# Patient Record
Sex: Female | Born: 1995
Health system: Southern US, Community
[De-identification: ages and names within clinical notes are randomized; demographics above are authoritative.]

## PROBLEM LIST (undated history)

## (undated) DIAGNOSIS — J189 Pneumonia, unspecified organism: Secondary | ICD-10-CM

## (undated) DIAGNOSIS — M25572 Pain in left ankle and joints of left foot: Secondary | ICD-10-CM

## (undated) DIAGNOSIS — S42309A Unspecified fracture of shaft of humerus, unspecified arm, initial encounter for closed fracture: Secondary | ICD-10-CM

## (undated) DIAGNOSIS — M357 Hypermobility syndrome: Secondary | ICD-10-CM

## (undated) HISTORY — DX: Pain in left ankle and joints of left foot: M25.572

## (undated) HISTORY — DX: Hypermobility syndrome: M35.7

## (undated) HISTORY — DX: Unspecified fracture of shaft of humerus, unspecified arm, initial encounter for closed fracture: S42.309A

## (undated) HISTORY — PX: TYMPANOSTOMY TUBE PLACEMENT: SHX32

## (undated) HISTORY — DX: Pneumonia, unspecified organism: J18.9

---
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
## 2016-01-16 ENCOUNTER — Telehealth: Payer: Self-pay | Admitting: Pediatrics

## 2016-01-16 NOTE — Telephone Encounter (Signed)
Anna Kirk has a stye and mom wants to know if you can call her in meds like you did last year to [facility] @ [phone]

## 2016-01-16 NOTE — Telephone Encounter (Signed)
Prescription has been called in 

## 2016-02-09 DIAGNOSIS — M25572 Pain in left ankle and joints of left foot: Secondary | ICD-10-CM | POA: Diagnosis not present

## 2016-02-22 DIAGNOSIS — Z79899 Other long term (current) drug therapy: Secondary | ICD-10-CM | POA: Diagnosis not present

## 2016-02-26 DIAGNOSIS — Z79899 Other long term (current) drug therapy: Secondary | ICD-10-CM | POA: Diagnosis not present

## 2016-03-27 DIAGNOSIS — M9905 Segmental and somatic dysfunction of pelvic region: Secondary | ICD-10-CM | POA: Diagnosis not present

## 2016-03-27 DIAGNOSIS — M9904 Segmental and somatic dysfunction of sacral region: Secondary | ICD-10-CM | POA: Diagnosis not present

## 2016-03-27 DIAGNOSIS — M791 Myalgia: Secondary | ICD-10-CM | POA: Diagnosis not present

## 2016-03-27 DIAGNOSIS — M9903 Segmental and somatic dysfunction of lumbar region: Secondary | ICD-10-CM | POA: Diagnosis not present

## 2016-04-26 DIAGNOSIS — J3089 Other allergic rhinitis: Secondary | ICD-10-CM | POA: Diagnosis not present

## 2016-05-03 DIAGNOSIS — M25572 Pain in left ankle and joints of left foot: Secondary | ICD-10-CM | POA: Diagnosis not present

## 2016-07-30 DIAGNOSIS — M25571 Pain in right ankle and joints of right foot: Secondary | ICD-10-CM | POA: Diagnosis not present

## 2016-07-30 DIAGNOSIS — M25572 Pain in left ankle and joints of left foot: Secondary | ICD-10-CM | POA: Diagnosis not present

## 2016-08-20 DIAGNOSIS — J3089 Other allergic rhinitis: Secondary | ICD-10-CM | POA: Diagnosis not present

## 2016-09-24 DIAGNOSIS — M25571 Pain in right ankle and joints of right foot: Secondary | ICD-10-CM | POA: Diagnosis not present

## 2016-09-25 DIAGNOSIS — H60392 Other infective otitis externa, left ear: Secondary | ICD-10-CM | POA: Diagnosis not present

## 2016-09-26 DIAGNOSIS — J3089 Other allergic rhinitis: Secondary | ICD-10-CM | POA: Diagnosis not present

## 2016-10-03 DIAGNOSIS — J3089 Other allergic rhinitis: Secondary | ICD-10-CM | POA: Diagnosis not present

## 2016-10-08 DIAGNOSIS — M25572 Pain in left ankle and joints of left foot: Secondary | ICD-10-CM | POA: Diagnosis not present

## 2016-10-11 DIAGNOSIS — J3089 Other allergic rhinitis: Secondary | ICD-10-CM | POA: Diagnosis not present

## 2016-10-17 DIAGNOSIS — H608X3 Other otitis externa, bilateral: Secondary | ICD-10-CM | POA: Diagnosis not present

## 2016-10-18 DIAGNOSIS — J3089 Other allergic rhinitis: Secondary | ICD-10-CM | POA: Diagnosis not present

## 2016-10-29 DIAGNOSIS — J3089 Other allergic rhinitis: Secondary | ICD-10-CM | POA: Diagnosis not present

## 2016-10-31 ENCOUNTER — Ambulatory Visit (HOSPITAL_BASED_OUTPATIENT_CLINIC_OR_DEPARTMENT_OTHER): Admission: RE | Admit: 2016-10-31 | Payer: Self-pay | Source: Ambulatory Visit | Admitting: Orthopedic Surgery

## 2016-10-31 ENCOUNTER — Encounter (HOSPITAL_BASED_OUTPATIENT_CLINIC_OR_DEPARTMENT_OTHER): Admission: RE | Payer: Self-pay | Source: Ambulatory Visit

## 2016-10-31 SURGERY — EXCISION, MORTON'S NEUROMA
Anesthesia: General | Site: Foot | Laterality: Right

## 2016-11-18 DIAGNOSIS — H608X3 Other otitis externa, bilateral: Secondary | ICD-10-CM | POA: Diagnosis not present

## 2016-11-18 DIAGNOSIS — H60392 Other infective otitis externa, left ear: Secondary | ICD-10-CM | POA: Diagnosis not present

## 2016-11-20 DIAGNOSIS — J3089 Other allergic rhinitis: Secondary | ICD-10-CM | POA: Diagnosis not present

## 2016-11-22 DIAGNOSIS — J3089 Other allergic rhinitis: Secondary | ICD-10-CM | POA: Diagnosis not present

## 2017-01-21 DIAGNOSIS — J3089 Other allergic rhinitis: Secondary | ICD-10-CM | POA: Diagnosis not present

## 2017-03-18 DIAGNOSIS — M25572 Pain in left ankle and joints of left foot: Secondary | ICD-10-CM | POA: Diagnosis not present

## 2017-03-26 DIAGNOSIS — J3089 Other allergic rhinitis: Secondary | ICD-10-CM | POA: Diagnosis not present

## 2017-04-30 DIAGNOSIS — H7312 Chronic myringitis, left ear: Secondary | ICD-10-CM | POA: Diagnosis not present

## 2017-04-30 DIAGNOSIS — H608X3 Other otitis externa, bilateral: Secondary | ICD-10-CM | POA: Diagnosis not present

## 2017-04-30 DIAGNOSIS — H60392 Other infective otitis externa, left ear: Secondary | ICD-10-CM | POA: Diagnosis not present

## 2017-09-19 DIAGNOSIS — M25572 Pain in left ankle and joints of left foot: Secondary | ICD-10-CM | POA: Diagnosis not present

## 2017-09-25 DIAGNOSIS — J3089 Other allergic rhinitis: Secondary | ICD-10-CM | POA: Diagnosis not present

## 2017-09-26 DIAGNOSIS — J3089 Other allergic rhinitis: Secondary | ICD-10-CM | POA: Diagnosis not present

## 2017-12-29 DIAGNOSIS — J029 Acute pharyngitis, unspecified: Secondary | ICD-10-CM | POA: Diagnosis not present

## 2017-12-31 ENCOUNTER — Other Ambulatory Visit: Payer: Self-pay | Admitting: Otolaryngology

## 2017-12-31 ENCOUNTER — Ambulatory Visit
Admission: RE | Admit: 2017-12-31 | Discharge: 2017-12-31 | Disposition: A | Discharge status: Home / Self Care | Payer: Federal, State, Local not specified - PPO | Source: Ambulatory Visit | Attending: Otolaryngology | Admitting: Otolaryngology

## 2017-12-31 DIAGNOSIS — J029 Acute pharyngitis, unspecified: Secondary | ICD-10-CM | POA: Diagnosis not present

## 2017-12-31 DIAGNOSIS — J351 Hypertrophy of tonsils: Secondary | ICD-10-CM | POA: Diagnosis not present

## 2017-12-31 DIAGNOSIS — R1310 Dysphagia, unspecified: Secondary | ICD-10-CM | POA: Diagnosis not present

## 2017-12-31 MED ORDER — IOPAMIDOL (ISOVUE-300) INJECTION 61%
75.0000 mL | Freq: Once | INTRAVENOUS | Status: AC | PRN
  Administered 2017-12-31: 75 mL via INTRAVENOUS

## 2018-01-01 DIAGNOSIS — L720 Epidermal cyst: Secondary | ICD-10-CM | POA: Diagnosis not present

## 2018-01-01 DIAGNOSIS — L700 Acne vulgaris: Secondary | ICD-10-CM | POA: Diagnosis not present

## 2018-01-01 DIAGNOSIS — J029 Acute pharyngitis, unspecified: Secondary | ICD-10-CM | POA: Diagnosis not present

## 2018-03-31 DIAGNOSIS — J3089 Other allergic rhinitis: Secondary | ICD-10-CM | POA: Diagnosis not present

## 2018-04-01 DIAGNOSIS — M25572 Pain in left ankle and joints of left foot: Secondary | ICD-10-CM | POA: Diagnosis not present

## 2018-04-23 DIAGNOSIS — J029 Acute pharyngitis, unspecified: Secondary | ICD-10-CM | POA: Diagnosis not present

## 2018-05-04 DIAGNOSIS — H608X3 Other otitis externa, bilateral: Secondary | ICD-10-CM | POA: Diagnosis not present

## 2018-05-04 DIAGNOSIS — H7311 Chronic myringitis, right ear: Secondary | ICD-10-CM | POA: Diagnosis not present

## 2018-09-23 DIAGNOSIS — J3089 Other allergic rhinitis: Secondary | ICD-10-CM | POA: Diagnosis not present

## 2018-10-20 DIAGNOSIS — J3089 Other allergic rhinitis: Secondary | ICD-10-CM | POA: Diagnosis not present

## 2018-10-23 DIAGNOSIS — J3089 Other allergic rhinitis: Secondary | ICD-10-CM | POA: Diagnosis not present

## 2018-10-27 DIAGNOSIS — J3089 Other allergic rhinitis: Secondary | ICD-10-CM | POA: Diagnosis not present

## 2018-10-28 DIAGNOSIS — M9904 Segmental and somatic dysfunction of sacral region: Secondary | ICD-10-CM | POA: Diagnosis not present

## 2018-10-28 DIAGNOSIS — M545 Low back pain: Secondary | ICD-10-CM | POA: Diagnosis not present

## 2018-10-28 DIAGNOSIS — M9905 Segmental and somatic dysfunction of pelvic region: Secondary | ICD-10-CM | POA: Diagnosis not present

## 2018-10-28 DIAGNOSIS — M9903 Segmental and somatic dysfunction of lumbar region: Secondary | ICD-10-CM | POA: Diagnosis not present

## 2018-10-30 DIAGNOSIS — J3089 Other allergic rhinitis: Secondary | ICD-10-CM | POA: Diagnosis not present

## 2018-11-04 DIAGNOSIS — J3089 Other allergic rhinitis: Secondary | ICD-10-CM | POA: Diagnosis not present

## 2018-11-09 DIAGNOSIS — J3089 Other allergic rhinitis: Secondary | ICD-10-CM | POA: Diagnosis not present

## 2018-11-11 DIAGNOSIS — M25552 Pain in left hip: Secondary | ICD-10-CM | POA: Diagnosis not present

## 2018-11-11 DIAGNOSIS — J3089 Other allergic rhinitis: Secondary | ICD-10-CM | POA: Diagnosis not present

## 2018-11-11 DIAGNOSIS — M25562 Pain in left knee: Secondary | ICD-10-CM | POA: Diagnosis not present

## 2018-11-16 DIAGNOSIS — J3089 Other allergic rhinitis: Secondary | ICD-10-CM | POA: Diagnosis not present

## 2018-11-18 DIAGNOSIS — M25552 Pain in left hip: Secondary | ICD-10-CM | POA: Diagnosis not present

## 2018-11-18 DIAGNOSIS — J3089 Other allergic rhinitis: Secondary | ICD-10-CM | POA: Diagnosis not present

## 2018-11-18 DIAGNOSIS — M25562 Pain in left knee: Secondary | ICD-10-CM | POA: Diagnosis not present

## 2018-11-20 DIAGNOSIS — J3089 Other allergic rhinitis: Secondary | ICD-10-CM | POA: Diagnosis not present

## 2018-11-24 DIAGNOSIS — J3089 Other allergic rhinitis: Secondary | ICD-10-CM | POA: Diagnosis not present

## 2018-11-25 DIAGNOSIS — M25562 Pain in left knee: Secondary | ICD-10-CM | POA: Diagnosis not present

## 2018-11-25 DIAGNOSIS — M25552 Pain in left hip: Secondary | ICD-10-CM | POA: Diagnosis not present

## 2018-11-26 DIAGNOSIS — J3089 Other allergic rhinitis: Secondary | ICD-10-CM | POA: Diagnosis not present

## 2018-12-01 DIAGNOSIS — J3089 Other allergic rhinitis: Secondary | ICD-10-CM | POA: Diagnosis not present

## 2018-12-02 DIAGNOSIS — M25552 Pain in left hip: Secondary | ICD-10-CM | POA: Diagnosis not present

## 2018-12-02 DIAGNOSIS — M25562 Pain in left knee: Secondary | ICD-10-CM | POA: Diagnosis not present

## 2018-12-09 DIAGNOSIS — M25562 Pain in left knee: Secondary | ICD-10-CM | POA: Diagnosis not present

## 2018-12-09 DIAGNOSIS — J3089 Other allergic rhinitis: Secondary | ICD-10-CM | POA: Diagnosis not present

## 2018-12-09 DIAGNOSIS — M25552 Pain in left hip: Secondary | ICD-10-CM | POA: Diagnosis not present

## 2018-12-23 DIAGNOSIS — M25562 Pain in left knee: Secondary | ICD-10-CM | POA: Diagnosis not present

## 2018-12-23 DIAGNOSIS — M25552 Pain in left hip: Secondary | ICD-10-CM | POA: Diagnosis not present

## 2018-12-29 DIAGNOSIS — J3089 Other allergic rhinitis: Secondary | ICD-10-CM | POA: Diagnosis not present

## 2018-12-30 DIAGNOSIS — M25562 Pain in left knee: Secondary | ICD-10-CM | POA: Diagnosis not present

## 2018-12-30 DIAGNOSIS — M25552 Pain in left hip: Secondary | ICD-10-CM | POA: Diagnosis not present

## 2019-01-01 DIAGNOSIS — J3089 Other allergic rhinitis: Secondary | ICD-10-CM | POA: Diagnosis not present

## 2019-01-01 DIAGNOSIS — Z1159 Encounter for screening for other viral diseases: Secondary | ICD-10-CM | POA: Diagnosis not present

## 2019-03-30 DIAGNOSIS — J029 Acute pharyngitis, unspecified: Secondary | ICD-10-CM | POA: Diagnosis not present

## 2019-03-31 DIAGNOSIS — J3089 Other allergic rhinitis: Secondary | ICD-10-CM | POA: Diagnosis not present

## 2019-04-02 DIAGNOSIS — Z20828 Contact with and (suspected) exposure to other viral communicable diseases: Secondary | ICD-10-CM | POA: Diagnosis not present

## 2019-05-04 DIAGNOSIS — D2230 Melanocytic nevi of unspecified part of face: Secondary | ICD-10-CM | POA: Diagnosis not present

## 2019-05-04 DIAGNOSIS — L700 Acne vulgaris: Secondary | ICD-10-CM | POA: Diagnosis not present

## 2019-05-11 DIAGNOSIS — Z20828 Contact with and (suspected) exposure to other viral communicable diseases: Secondary | ICD-10-CM | POA: Diagnosis not present

## 2019-05-14 ENCOUNTER — Ambulatory Visit: Payer: Federal, State, Local not specified - PPO | Attending: Internal Medicine

## 2019-05-14 DIAGNOSIS — Z20822 Contact with and (suspected) exposure to covid-19: Secondary | ICD-10-CM

## 2019-05-16 LAB — NOVEL CORONAVIRUS, NAA: SARS-CoV-2, NAA: NOT DETECTED

## 2019-09-22 DIAGNOSIS — M545 Low back pain: Secondary | ICD-10-CM | POA: Diagnosis not present

## 2019-09-22 DIAGNOSIS — M25552 Pain in left hip: Secondary | ICD-10-CM | POA: Diagnosis not present

## 2019-09-28 DIAGNOSIS — M25559 Pain in unspecified hip: Secondary | ICD-10-CM | POA: Diagnosis not present

## 2019-09-28 DIAGNOSIS — L700 Acne vulgaris: Secondary | ICD-10-CM | POA: Diagnosis not present

## 2019-09-28 DIAGNOSIS — D2230 Melanocytic nevi of unspecified part of face: Secondary | ICD-10-CM | POA: Diagnosis not present

## 2019-09-28 DIAGNOSIS — M545 Low back pain: Secondary | ICD-10-CM | POA: Diagnosis not present

## 2019-09-30 ENCOUNTER — Ambulatory Visit (INDEPENDENT_AMBULATORY_CARE_PROVIDER_SITE_OTHER): Payer: Federal, State, Local not specified - PPO | Admitting: Plastic Surgery

## 2019-09-30 ENCOUNTER — Encounter: Payer: Self-pay | Admitting: Plastic Surgery

## 2019-09-30 ENCOUNTER — Other Ambulatory Visit: Payer: Self-pay

## 2019-09-30 DIAGNOSIS — L989 Disorder of the skin and subcutaneous tissue, unspecified: Secondary | ICD-10-CM | POA: Insufficient documentation

## 2019-09-30 NOTE — Progress Notes (Signed)
Patient ID: Anna Kirk, female    DOB: 1995/06/18, 24 y.o.   MRN: [id number]   Chief Complaint  Patient presents with  . Advice Only    for mole on cheekbone    The patient is a 24 year old female here for evaluation of a changing skin lesion left cheek. The patient noticed over the past several years this red area. Sometimes it has some hair that grows out of it. It gets irritated scabs. She is suffering from a lot of skin breakdown at the moment. She is on oral antibiotics and tretinoin. She is not aware of any trauma to her cheek area. It is 2 mm in size. It is on the upper mid face cheek area. It is slightly irritated with a slight central ulcer. She was using the tretinoin every day and had a very strong reaction. She went to once a week and then twice a week which seems to be a little bit better but she still has a lot of around her lower face and forehead area. This is very distressing.   Review of Systems  Constitutional: Negative.   HENT: Negative.   Eyes: Negative.   Respiratory: Negative.   Cardiovascular: Negative.   Gastrointestinal: Negative.   Endocrine: Negative.   Genitourinary: Negative.   Musculoskeletal: Negative.   Skin: Positive for color change and wound.  Neurological: Negative.   Psychiatric/Behavioral: Negative.     Past Medical History:  Diagnosis Date  . Allergic rhinitis 04/09/2011  . Ankle pain, left 01/22/2011   chronic left ankle pain -- since sprain 18 months ago  . Benign hypermobility syndrome   . Fracture closed, humerus 2000   left supracondylar  . Pneumonia 2010   recurrent as infant, saw pulmonologist, finally resolved    Past Surgical History:  Procedure Laterality Date  . TYMPANOSTOMY TUBE PLACEMENT        Current Outpatient Medications:  .  ampicillin (PRINCIPEN) 500 MG capsule, Take 1 capsule by mouth daily., Disp: , Rfl:  .  Azelastine-Fluticasone (DYMISTA) 137-50 MCG/ACT SUSP, Place 1 spray into the nose at bedtime.,  Disp: , Rfl:  .  EPINEPHrine (EPIPEN 2-PAK) 0.3 mg/0.3 mL IJ SOAJ injection, AS DIRECTED, Disp: , Rfl:  .  levocetirizine (XYZAL) 5 MG tablet, Take 5 mg by mouth at bedtime., Disp: , Rfl:  .  montelukast (SINGULAIR) 10 MG tablet, Take 10 mg by mouth at bedtime., Disp: , Rfl:  .  Norethin Ace-Eth Estrad-FE 1-20 MG-MCG(24) CHEW, Chew 1 tablet by mouth daily., Disp: , Rfl:  .  tretinoin (RETIN-A) 0.025 % cream, Apply 1 application topically as needed., Disp: , Rfl:    Objective:   Vitals:   09/30/19 0814  BP: 116/77  Pulse: (!) 52  Temp: (!) 97.5 F (36.4 C)  SpO2: 99%    Physical Exam Vitals and nursing note reviewed.  Constitutional:      Appearance: Normal appearance.  HENT:     Head: Normocephalic and atraumatic.  Cardiovascular:     Rate and Rhythm: Normal rate.     Pulses: Normal pulses.  Pulmonary:     Effort: Pulmonary effort is normal.  Abdominal:     General: Abdomen is flat. There is no distension.     Tenderness: There is no abdominal tenderness.  Skin:    General: Skin is warm.     Capillary Refill: Capillary refill takes less than 2 seconds.     Findings: Erythema present.  Neurological:  General: No focal deficit present.     Mental Status: She is alert and oriented to person, place, and time.  Psychiatric:        Mood and Affect: Mood normal.        Behavior: Behavior normal.        Thought Content: Thought content normal.     Assessment & Plan:  Changing skin lesion  Recommend excision of a changing skin lesion of the left cheek. I also recommended the bioserum retinoid by Sente.  Pictures were obtained of the patient and placed in the chart with the patient's or guardian's permission.   Alena Bills Dillingham, DO

## 2019-10-19 DIAGNOSIS — M545 Low back pain: Secondary | ICD-10-CM | POA: Diagnosis not present

## 2019-10-19 DIAGNOSIS — M25552 Pain in left hip: Secondary | ICD-10-CM | POA: Diagnosis not present

## 2019-10-19 DIAGNOSIS — M461 Sacroiliitis, not elsewhere classified: Secondary | ICD-10-CM | POA: Diagnosis not present

## 2019-10-20 ENCOUNTER — Ambulatory Visit: Payer: Federal, State, Local not specified - PPO | Admitting: Plastic Surgery

## 2019-10-20 ENCOUNTER — Other Ambulatory Visit: Payer: Self-pay

## 2019-10-20 ENCOUNTER — Encounter: Payer: Self-pay | Admitting: Plastic Surgery

## 2019-10-20 ENCOUNTER — Other Ambulatory Visit (HOSPITAL_COMMUNITY)
Admission: RE | Admit: 2019-10-20 | Discharge: 2019-10-20 | Disposition: A | Discharge status: Home / Self Care | Payer: Federal, State, Local not specified - PPO | Source: Ambulatory Visit | Attending: Plastic Surgery | Admitting: Plastic Surgery

## 2019-10-20 VITALS — BP 116/77 | HR 51 | Temp 97.5°F | Ht 73.0 in | Wt 178.0 lb

## 2019-10-20 DIAGNOSIS — M545 Low back pain: Secondary | ICD-10-CM | POA: Diagnosis not present

## 2019-10-20 DIAGNOSIS — L989 Disorder of the skin and subcutaneous tissue, unspecified: Secondary | ICD-10-CM

## 2019-10-20 DIAGNOSIS — Q859 Phakomatosis, unspecified: Secondary | ICD-10-CM | POA: Diagnosis not present

## 2019-10-20 DIAGNOSIS — M461 Sacroiliitis, not elsewhere classified: Secondary | ICD-10-CM | POA: Diagnosis not present

## 2019-10-20 DIAGNOSIS — M25552 Pain in left hip: Secondary | ICD-10-CM | POA: Diagnosis not present

## 2019-10-20 NOTE — Progress Notes (Signed)
Procedure Note  Preoperative Dx: changing skin lesion face  Postoperative Dx: Same  Procedure: Excision of changing skin lesion left face 3 mm  Anesthesia: Lidocaine 1% with 1:100,000 epinepherine   Description of Procedure: Risks and complications were explained to the patient.  Consent was confirmed and the patient understands the risks and benefits.  The potential complications and alternatives were explained and the patient consents.  The patient expressed understanding the option of not having the procedure and the risks of a scar.  Time out was called and all information was confirmed to be correct.    The area was prepped and drapped.  Lidocaine 1% with epinepherine was injected in the subcutaneous area.  After waiting several minutes for the local to take affect a #15 blade was used to excise the area in an eliptical pattern.  A 6-0 Monocryl was used to close the skin edges.  A dressing was applied.  The patient was given instructions on how to care for the area and a follow up appointment.  Anna Kirk tolerated the procedure well and there were no complications. The specimen was sent to pathology.

## 2019-10-21 LAB — SURGICAL PATHOLOGY

## 2019-11-02 ENCOUNTER — Other Ambulatory Visit: Payer: Self-pay

## 2019-11-02 ENCOUNTER — Ambulatory Visit (INDEPENDENT_AMBULATORY_CARE_PROVIDER_SITE_OTHER): Payer: Federal, State, Local not specified - PPO | Admitting: Plastic Surgery

## 2019-11-02 ENCOUNTER — Encounter: Payer: Self-pay | Admitting: Plastic Surgery

## 2019-11-02 DIAGNOSIS — Z719 Counseling, unspecified: Secondary | ICD-10-CM

## 2019-11-02 DIAGNOSIS — Q859 Phakomatosis, unspecified: Secondary | ICD-10-CM

## 2019-11-02 NOTE — Progress Notes (Signed)
The patient is a 24 year old female here for follow-up on her skin excision.  A lesion on the left cheek.  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  No atypia was noted.  The area is healing well.  No sign of infection.  He was removed.  Steri-Strip was reapplied.  Patient was told about skin recommend continuing exposure limitation.

## 2019-11-04 DIAGNOSIS — Z719 Counseling, unspecified: Secondary | ICD-10-CM

## 2019-11-18 DIAGNOSIS — M461 Sacroiliitis, not elsewhere classified: Secondary | ICD-10-CM | POA: Diagnosis not present

## 2019-11-18 DIAGNOSIS — M545 Low back pain: Secondary | ICD-10-CM | POA: Diagnosis not present

## 2020-01-11 DIAGNOSIS — L700 Acne vulgaris: Secondary | ICD-10-CM | POA: Diagnosis not present

## 2020-02-26 DIAGNOSIS — Z20822 Contact with and (suspected) exposure to covid-19: Secondary | ICD-10-CM | POA: Diagnosis not present

## 2020-03-29 DIAGNOSIS — J3089 Other allergic rhinitis: Secondary | ICD-10-CM | POA: Diagnosis not present
